# Patient Record
Sex: Male | Born: 1970 | Race: Black or African American | Hispanic: No | Smoking: Never smoker
Health system: Southern US, Community
[De-identification: ages and names within clinical notes are randomized; demographics above are authoritative.]

## PROBLEM LIST (undated history)

## (undated) HISTORY — PX: GASTRIC BYPASS: SHX52

---

## 2019-03-17 ENCOUNTER — Encounter (HOSPITAL_COMMUNITY): Payer: Self-pay | Admitting: Family Medicine

## 2019-03-17 ENCOUNTER — Ambulatory Visit (HOSPITAL_COMMUNITY)
Admission: EM | Admit: 2019-03-17 | Discharge: 2019-03-17 | Disposition: A | Discharge status: Home / Self Care | Payer: BLUE CROSS/BLUE SHIELD | Attending: Family Medicine | Admitting: Family Medicine

## 2019-03-17 ENCOUNTER — Other Ambulatory Visit: Payer: Self-pay

## 2019-03-17 DIAGNOSIS — R52 Pain, unspecified: Secondary | ICD-10-CM | POA: Diagnosis not present

## 2019-03-17 DIAGNOSIS — R51 Headache: Secondary | ICD-10-CM

## 2019-03-17 DIAGNOSIS — E860 Dehydration: Secondary | ICD-10-CM

## 2019-03-17 DIAGNOSIS — B349 Viral infection, unspecified: Secondary | ICD-10-CM

## 2019-03-17 DIAGNOSIS — R509 Fever, unspecified: Secondary | ICD-10-CM

## 2019-03-17 LAB — POCT URINALYSIS DIP (DEVICE)
Glucose, UA: 100 mg/dL — AB
Ketones, ur: 40 mg/dL — AB
Leukocytes,Ua: NEGATIVE
Nitrite: NEGATIVE
Protein, ur: 100 mg/dL — AB
Specific Gravity, Urine: >=1.03 (ref 1.005–1.030)
Urobilinogen, UA: 0.2 mg/dL (ref 0.0–1.0)
pH: 5.5 (ref 5.0–8.0)

## 2019-03-17 MED ORDER — ACETAMINOPHEN 325 MG PO TABS
ORAL_TABLET | ORAL | Status: AC
  Filled 2019-03-17: qty 2

## 2019-03-17 MED ORDER — ACETAMINOPHEN 325 MG PO TABS
650.0000 mg | ORAL_TABLET | Freq: Once | ORAL | Status: AC
  Administered 2019-03-17: 650 mg via ORAL

## 2019-03-17 NOTE — ED Notes (Signed)
Patient verbalizes understanding of discharge instructions. Opportunity for questioning and answers were provided. Patient discharged from [facility] by provider.  

## 2019-03-17 NOTE — Discharge Instructions (Addendum)
This is some sort of viral illness. Unfortunately we have no way to test for the coronavirus so I cannot rule this out I would like for you to go home and quarantine for the next 7 days. You can take up to 1000 mg of Tylenol every 8 hours You can take up to 800 mg of ibuprofen every 8 hours Make sure you are staying hydrated and drink plenty of fluids include water and Gatorade to keep your electrolytes If your symptoms worsen to include chest pain, shortness of breath please go to the ER

## 2019-03-17 NOTE — ED Triage Notes (Signed)
Pt presents with nausea, headache, generalized body aches, and fever since Monday.

## 2019-03-18 NOTE — ED Provider Notes (Signed)
[facility]    CSN: [id number] Arrival date & time: 03/17/19  1524     History   Chief Complaint Chief Complaint  Patient presents with  . Generalized Body Aches  . Fever    HPI Randall Campbell is a 48 y.o. male.   Pt is a 48 year old male that presents with fever, body aches, headache since Monday. Symptoms have been constant and remain the same. He has been taking tylenol, one time a day for symptoms. No recent sick contacts or exposure to COVID. Denies any associated cough, congestion, rhinorrhea, sore throat, chest pain, SOB. Denies any urinary symptoms, abdominal pain, vomiting or diarrhea.   ROS per HPI    Fever    History reviewed. No pertinent past medical history.  There are no active problems to display for this patient.   Past Surgical History:  Procedure Laterality Date  . GASTRIC BYPASS         Home Medications    Prior to Admission medications   Not on File    Family History Family History  Family history unknown: Yes    Social History Social History   Tobacco Use  . Smoking status: Never Smoker  . Smokeless tobacco: Never Used  Substance Use Topics  . Alcohol use: Not on file  . Drug use: Not on file     Allergies   Patient has no known allergies.   Review of Systems Review of Systems  Constitutional: Positive for fever.     Physical Exam Triage Vital Signs ED Triage Vitals  Enc Vitals Group     BP 03/17/19 1602 112/90     Pulse Rate 03/17/19 1602 99     Resp 03/17/19 1602 18     Temp 03/17/19 1602 (!) 102.7 F (39.3 C)     Temp Source 03/17/19 1602 Oral     SpO2 03/17/19 1602 98 %     Weight --      Height --      Head Circumference --      Peak Flow --      Pain Score 03/17/19 1605 7     Pain Loc --      Pain Edu? --      Excl. in GC? --    No data found.  Updated Vital Signs BP 112/90 (BP Location: Right Arm)   Pulse 99   Temp (!) 102.7 F (39.3 C) (Oral)   Resp 18   SpO2 98%    Visual Acuity Right Eye Distance:   Left Eye Distance:   Bilateral Distance:    Right Eye Near:   Left Eye Near:    Bilateral Near:     Physical Exam Vitals signs and nursing note reviewed.  Constitutional:      General: He is not in acute distress.    Appearance: Normal appearance. He is well-developed. He is not ill-appearing, toxic-appearing or diaphoretic.  HENT:     Head: Normocephalic and atraumatic.     Right Ear: Tympanic membrane and ear canal normal.     Left Ear: Tympanic membrane and ear canal normal.     Nose: Nose normal.     Mouth/Throat:     Pharynx: Oropharynx is clear.  Eyes:     Conjunctiva/sclera: Conjunctivae normal.  Neck:     Musculoskeletal: Normal range of motion and neck supple.  Cardiovascular:     Rate and Rhythm: Normal rate and regular rhythm.     Heart  sounds: No murmur.  Pulmonary:     Effort: Pulmonary effort is normal. No respiratory distress.     Breath sounds: Normal breath sounds.  Abdominal:     Palpations: Abdomen is soft.     Tenderness: There is no abdominal tenderness.  Musculoskeletal: Normal range of motion.  Lymphadenopathy:     Cervical: No cervical adenopathy.  Skin:    General: Skin is warm and dry.  Neurological:     Mental Status: He is alert.  Psychiatric:        Mood and Affect: Mood normal.      UC Treatments / Results  Labs (all labs ordered are listed, but only abnormal results are displayed) Labs Reviewed  POCT URINALYSIS DIP (DEVICE) - Abnormal; Notable for the following components:      Result Value   Glucose, UA 100 (*)    Bilirubin Urine SMALL (*)    Ketones, ur 40 (*)    Hgb urine dipstick TRACE (*)    Protein, ur 100 (*)    All other components within normal limits    EKG None  Radiology No results found.  Procedures Procedures (including critical care time)  Medications Ordered in UC Medications  acetaminophen (TYLENOL) tablet 650 mg (650 mg Oral Given 03/17/19 1612)    Initial  Impression / Assessment and Plan / UC Course  I have reviewed the triage vital signs and the nursing notes.  Pertinent labs & imaging results that were available during my care of the patient were reviewed by me and considered in my medical decision making (see chart for details).    Viral illness-   Exam normal VSS besides fever and he is non toxic or ill appearing.  Cannot r/o COVID at this point although he has no respiratory symptoms.  I feel the safest thing is to have him quarantine.  Urine showed mild dehydration. Recommended he increase fluids.  Strict precautions that if his symptoms worsen to include SOB he needs to go to the ER. Otherwise just quarantine for now. He can take tylenol or ibuprofen for the fever and pain.  Pt understanding and agreed.  Final Clinical Impressions(s) / UC Diagnoses   Final diagnoses:  None     Discharge Instructions     This is some sort of viral illness. Unfortunately we have no way to test for the coronavirus so I cannot rule this out I would like for you to go home and quarantine for the next 7 days. You can take up to 1000 mg of Tylenol every 8 hours You can take up to 800 mg of ibuprofen every 8 hours Make sure you are staying hydrated and drink plenty of fluids include water and Gatorade to keep your electrolytes If your symptoms worsen to include chest pain, shortness of breath please go to the ER    ED Prescriptions    None     Controlled Substance Prescriptions [place] Controlled Substance Registry consulted? Not Applicable   Janace Aris, NP 03/18/19 [phone]

## 2019-03-19 ENCOUNTER — Encounter (HOSPITAL_COMMUNITY): Payer: Self-pay | Admitting: Emergency Medicine

## 2019-03-19 ENCOUNTER — Emergency Department (HOSPITAL_COMMUNITY)
Admission: EM | Admit: 2019-03-19 | Discharge: 2019-03-19 | Disposition: A | Discharge status: Home / Self Care | Payer: BLUE CROSS/BLUE SHIELD | Attending: Emergency Medicine | Admitting: Emergency Medicine

## 2019-03-19 ENCOUNTER — Emergency Department (HOSPITAL_COMMUNITY): Payer: BLUE CROSS/BLUE SHIELD

## 2019-03-19 ENCOUNTER — Other Ambulatory Visit: Payer: Self-pay

## 2019-03-19 DIAGNOSIS — R509 Fever, unspecified: Secondary | ICD-10-CM | POA: Diagnosis present

## 2019-03-19 DIAGNOSIS — R6889 Other general symptoms and signs: Secondary | ICD-10-CM

## 2019-03-19 DIAGNOSIS — Z951 Presence of aortocoronary bypass graft: Secondary | ICD-10-CM | POA: Insufficient documentation

## 2019-03-19 DIAGNOSIS — Z20822 Contact with and (suspected) exposure to covid-19: Secondary | ICD-10-CM

## 2019-03-19 MED ORDER — AZITHROMYCIN 250 MG PO TABS: ORAL_TABLET | ORAL | 0 refills | Status: DC

## 2019-03-19 MED ORDER — ACETAMINOPHEN 500 MG PO TABS
1000.0000 mg | ORAL_TABLET | Freq: Once | ORAL | Status: AC
  Administered 2019-03-19: 1000 mg via ORAL
  Filled 2019-03-19: qty 2

## 2019-03-19 MED ORDER — AMOXICILLIN 500 MG PO CAPS: 500.0000 mg | ORAL_CAPSULE | Freq: Three times a day (TID) | ORAL | 0 refills | Status: DC

## 2019-03-19 NOTE — ED Provider Notes (Signed)
[facility] EMERGENCY DEPARTMENT Provider Note   CSN: [id number] Arrival date & time: 03/19/19  1823    History   Chief Complaint Chief Complaint  Patient presents with  . Generalized Body Aches    HPI Randall Campbell is a 48 y.o. male.     The history is provided by the patient and medical records. No language interpreter was used.     48 year old male presenting with flulike symptoms.  Patient report for the past week he has had fever, chills, body aches, headache, and generalized fatigue.  Furthermore, he also report loss of taste and smell, and loss of appetite.  States he is very weak, requiring quite a bit of energy just to function.  He has been self isolating.  He denies any recent sick contact or any recent travel to hospitalization of COVID-19.  He was seen at urgent care 2 days ago for his symptoms and was given symptomatic treatment.  He is here because his symptoms has not improved.  He does not have any significant past medical history.  Has been taking Tylenol and ibuprofen at home.  He is not a smoker.  History reviewed. No pertinent past medical history.  There are no active problems to display for this patient.   Past Surgical History:  Procedure Laterality Date  . GASTRIC BYPASS          Home Medications    Prior to Admission medications   Not on File    Family History Family History  Family history unknown: Yes    Social History Social History   Tobacco Use  . Smoking status: Never Smoker  . Smokeless tobacco: Never Used  Substance Use Topics  . Alcohol use: Not on file  . Drug use: Not on file     Allergies   Patient has no known allergies.   Review of Systems Review of Systems  All other systems reviewed and are negative.    Physical Exam Updated Vital Signs BP 123/80 (BP Location: Right Arm)   Pulse (!) 103   Temp (!) 101 F (38.3 C) (Oral)   Resp (!) 21   SpO2 95%   Physical Exam Vitals  signs and nursing note reviewed.  Constitutional:      General: He is not in acute distress.    Appearance: He is well-developed.  HENT:     Head: Atraumatic.     Mouth/Throat:     Mouth: Mucous membranes are moist.  Eyes:     Conjunctiva/sclera: Conjunctivae normal.  Neck:     Musculoskeletal: Neck supple. No neck rigidity.  Cardiovascular:     Rate and Rhythm: Tachycardia present.     Pulses: Normal pulses.     Heart sounds: Normal heart sounds.  Pulmonary:     Effort: Pulmonary effort is normal.     Breath sounds: Normal breath sounds. No wheezing, rhonchi or rales.  Abdominal:     Palpations: Abdomen is soft.     Tenderness: There is no abdominal tenderness.  Musculoskeletal:        General: No swelling.  Skin:    Findings: No rash.  Neurological:     Mental Status: He is alert and oriented to person, place, and time.  Psychiatric:        Mood and Affect: Mood normal.      ED Treatments / Results  Labs (all labs ordered are listed, but only abnormal results are displayed) Labs Reviewed - No data to  display  EKG None  Radiology Dg Chest Portable 1 View  Result Date: 03/19/2019 CLINICAL DATA:  Five day history of fever and body aches EXAM: PORTABLE CHEST 1 VIEW COMPARISON:  None. FINDINGS: Lungs are adequately inflated with subtle hazy prominence of the bronchovascular markings over the right upper lung and lung bases. No focal lobar consolidation or effusion. Cardiomediastinal silhouette is within normal. There are degenerative changes of the spine. IMPRESSION: Subtle hazy prominence of the bronchovascular markings over the lung bases and right upper lung which may be seen with atypical infection, inflammatory processes or possible viral pneumonia. Electronically Signed   By: Elberta Fortis M.D.   On: 03/19/2019 19:57    Procedures Procedures (including critical care time)  Medications Ordered in ED Medications  acetaminophen (TYLENOL) tablet 1,000 mg (1,000 mg  Oral Given 03/19/19 2001)     Initial Impression / Assessment and Plan / ED Course  I have reviewed the triage vital signs and the nursing notes.  Pertinent labs & imaging results that were available during my care of the patient were reviewed by me and considered in my medical decision making (see chart for details).        BP 123/80 (BP Location: Right Arm)   Pulse (!) 103   Temp (!) 101 F (38.3 C) (Oral)   Resp (!) 21   SpO2 95%    Final Clinical Impressions(s) / ED Diagnoses   Final diagnoses:  Suspected Covid-19 Virus Infection    ED Discharge Orders         Ordered    amoxicillin (AMOXIL) 500 MG capsule  3 times daily     03/19/19 2040    azithromycin (ZITHROMAX Z-PAK) 250 MG tablet     03/19/19 2040         Patient presents with symptoms concerning for COVID-19.  He is febrile, with a report of generalized myalgias, and fatigue.  Chest x-ray shows subtle hazy prominence of the bronchovascular markings suggestive of atypical infection possible viral pneumonia.  Fortunately he is not hypoxic, able to carry in normal conversation and is otherwise well-appearing.  Patient does not meet requirement for admission at this time.  Will obtain COVID-19 testing given history current hospital visit for same symptoms and potential worsening of his condition.  Encourage patient to self isolate, continue to hydrate use, and take Tylenol for his symptoms.  Explained to him that symptoms may last for 1 to 2 weeks.  Patient was given strict return precaution as well as strict home isolation.  Patient voiced understanding and agrees with plan. Will prescribe azithromycin and amoxicillin to cover for atypical pneumonia.   Randall Campbell was evaluated in Emergency Department on 03/19/2019 for the symptoms described in the history of present illness. He was evaluated in the context of the global COVID-19 pandemic, which necessitated consideration that the patient might be at risk for  infection with the SARS-CoV-2 virus that causes COVID-19. Institutional protocols and algorithms that pertain to the evaluation of patients at risk for COVID-19 are in a state of rapid change based on information released by regulatory bodies including the CDC and federal and state organizations. These policies and algorithms were followed during the patient's care in the ED.    Fayrene Helper, PA-C 03/19/19 2043    Melene Plan, DO 03/19/19 2221

## 2019-03-19 NOTE — ED Triage Notes (Signed)
Pt arrives to ED with c/o of 5 day hx of fevers and body aches. Seen at [facility] on 4/1

## 2019-03-19 NOTE — ED Notes (Signed)
Reports last took tylenol at 0700 this morning.  Patient brought own tylenol with him but has not taken any since then.  States he has been taking this every 8 hours.

## 2019-03-19 NOTE — Discharge Instructions (Signed)
Person Under Monitoring Name: Randall Campbell  Location: [address] [place] [address]   Infection Prevention Recommendations for Individuals Confirmed to have, or Being Evaluated for, 2019 Novel Coronavirus (COVID-19) Infection Who Receive Care at Home  Individuals who are confirmed to have, or are being evaluated for, COVID-19 should follow the prevention steps below until a healthcare provider or local or state health department says they can return to normal activities.  Stay home except to get medical care You should restrict activities outside your home, except for getting medical care. Do not go to work, school, or public areas, and do not use public transportation or taxis.  Call ahead before visiting your doctor Before your medical appointment, call the healthcare provider and tell them that you have, or are being evaluated for, COVID-19 infection. This will help the healthcare providers office take steps to keep other people from getting infected. Ask your healthcare provider to call the local or state health department.  Monitor your symptoms Seek prompt medical attention if your illness is worsening (e.g., difficulty breathing). Before going to your medical appointment, call the healthcare provider and tell them that you have, or are being evaluated for, COVID-19 infection. Ask your healthcare provider to call the local or state health department.  Wear a facemask You should wear a facemask that covers your nose and mouth when you are in the same room with other people and when you visit a healthcare provider. People who live with or visit you should also wear a facemask while they are in the same room with you.  Separate yourself from other people in your home As much as possible, you should stay in a different room from other people in your home. Also, you should use a separate bathroom, if available.  Avoid sharing household items You should not  share dishes, drinking glasses, cups, eating utensils, towels, bedding, or other items with other people in your home. After using these items, you should wash them thoroughly with soap and water.  Cover your coughs and sneezes Cover your mouth and nose with a tissue when you cough or sneeze, or you can cough or sneeze into your sleeve. Throw used tissues in a lined trash can, and immediately wash your hands with soap and water for at least 20 seconds or use an alcohol-based hand rub.  Wash your [employer] your hands often and thoroughly with soap and water for at least 20 seconds. You can use an alcohol-based hand sanitizer if soap and water are not available and if your hands are not visibly dirty. Avoid touching your eyes, nose, and mouth with unwashed hands.   Prevention Steps for Caregivers and Household Members of Individuals Confirmed to have, or Being Evaluated for, COVID-19 Infection Being Cared for in the Home  If you live with, or provide care at home for, a person confirmed to have, or being evaluated for, COVID-19 infection please follow these guidelines to prevent infection:  Follow healthcare providers instructions Make sure that you understand and can help the patient follow any healthcare provider instructions for all care.  Provide for the patients basic needs You should help the patient with basic needs in the home and provide support for getting groceries, prescriptions, and other personal needs.  Monitor the patients symptoms If they are getting sicker, call his or her medical provider and tell them that the patient has, or is being evaluated for, COVID-19 infection. This will help the healthcare providers office  take steps to keep other people from getting infected. Ask the healthcare provider to call the local or state health department.  Limit the number of people who have contact with the patient If possible, have only one caregiver for the  patient. Other household members should stay in another home or place of residence. If this is not possible, they should stay in another room, or be separated from the patient as much as possible. Use a separate bathroom, if available. Restrict visitors who do not have an essential need to be in the home.  Keep older adults, very young children, and other sick people away from the patient Keep older adults, very young children, and those who have compromised immune systems or chronic health conditions away from the patient. This includes people with chronic heart, lung, or kidney conditions, diabetes, and cancer.  Ensure good ventilation Make sure that shared spaces in the home have good air flow, such as from an air conditioner or an opened window, weather permitting.  Wash your hands often Wash your hands often and thoroughly with soap and water for at least 20 seconds. You can use an alcohol based hand sanitizer if soap and water are not available and if your hands are not visibly dirty. Avoid touching your eyes, nose, and mouth with unwashed hands. Use disposable paper towels to dry your hands. If not available, use dedicated cloth towels and replace them when they become wet.  Wear a facemask and gloves Wear a disposable facemask at all times in the room and gloves when you touch or have contact with the patients blood, body fluids, and/or secretions or excretions, such as sweat, saliva, sputum, nasal mucus, vomit, urine, or feces.  Ensure the mask fits over your nose and mouth tightly, and do not touch it during use. Throw out disposable facemasks and gloves after using them. Do not reuse. Wash your hands immediately after removing your facemask and gloves. If your personal clothing becomes contaminated, carefully remove clothing and launder. Wash your hands after handling contaminated clothing. Place all used disposable facemasks, gloves, and other waste in a lined container before  disposing them with other household waste. Remove gloves and wash your hands immediately after handling these items.  Do not share dishes, glasses, or other household items with the patient Avoid sharing household items. You should not share dishes, drinking glasses, cups, eating utensils, towels, bedding, or other items with a patient who is confirmed to have, or being evaluated for, COVID-19 infection. After the person uses these items, you should wash them thoroughly with soap and water.  Wash laundry thoroughly Immediately remove and wash clothes or bedding that have blood, body fluids, and/or secretions or excretions, such as sweat, saliva, sputum, nasal mucus, vomit, urine, or feces, on them. Wear gloves when handling laundry from the patient. Read and follow directions on labels of laundry or clothing items and detergent. In general, wash and dry with the warmest temperatures recommended on the label.  Clean all areas the individual has used often Clean all touchable surfaces, such as counters, tabletops, doorknobs, bathroom fixtures, toilets, phones, keyboards, tablets, and bedside tables, every day. Also, clean any surfaces that may have blood, body fluids, and/or secretions or excretions on them. Wear gloves when cleaning surfaces the patient has come in contact with. Use a diluted bleach solution (e.g., dilute bleach with 1 part bleach and 10 parts water) or a household disinfectant with a label that says EPA-registered for coronaviruses. To make a bleach  solution at home, add 1 tablespoon of bleach to 1 quart (4 cups) of water. For a larger supply, add  cup of bleach to 1 gallon (16 cups) of water. Read labels of cleaning products and follow recommendations provided on product labels. Labels contain instructions for safe and effective use of the cleaning product including precautions you should take when applying the product, such as wearing gloves or eye protection and making sure you  have good ventilation during use of the product. Remove gloves and wash hands immediately after cleaning.  Monitor yourself for signs and symptoms of illness Caregivers and household members are considered close contacts, should monitor their health, and will be asked to limit movement outside of the home to the extent possible. Follow the monitoring steps for close contacts listed on the symptom monitoring form.   ? If you have additional questions, contact your local health department or call the epidemiologist on call at [phone] (available 24/7). ? This guidance is subject to change. For the most up-to-date guidance from [facility], please refer to their website: [email or web address]

## 2019-03-20 ENCOUNTER — Telehealth: Payer: Self-pay | Admitting: Internal Medicine

## 2019-03-20 NOTE — Telephone Encounter (Signed)
Spoke with patient to give results of covid 19 positive. Gave recommendations for public health

## 2019-03-22 LAB — NOVEL CORONAVIRUS, NAA (HOSP ORDER, SEND-OUT TO REF LAB; TAT 18-24 HRS): SARS-CoV-2, NAA: DETECTED — AB

## 2019-03-23 ENCOUNTER — Inpatient Hospital Stay (HOSPITAL_COMMUNITY)
Admission: EM | Admit: 2019-03-23 | Discharge: 2019-03-27 | DRG: 177 | Disposition: A | Discharge status: Home / Self Care | Payer: BLUE CROSS/BLUE SHIELD | Attending: Internal Medicine | Admitting: Internal Medicine

## 2019-03-23 DIAGNOSIS — Z9884 Bariatric surgery status: Secondary | ICD-10-CM

## 2019-03-23 DIAGNOSIS — J988 Other specified respiratory disorders: Secondary | ICD-10-CM

## 2019-03-23 DIAGNOSIS — J1289 Other viral pneumonia: Secondary | ICD-10-CM | POA: Diagnosis present

## 2019-03-23 DIAGNOSIS — J1282 Pneumonia due to coronavirus disease 2019: Secondary | ICD-10-CM

## 2019-03-23 DIAGNOSIS — N179 Acute kidney failure, unspecified: Secondary | ICD-10-CM | POA: Diagnosis present

## 2019-03-23 DIAGNOSIS — R0602 Shortness of breath: Secondary | ICD-10-CM | POA: Diagnosis not present

## 2019-03-23 DIAGNOSIS — J9601 Acute respiratory failure with hypoxia: Secondary | ICD-10-CM | POA: Diagnosis present

## 2019-03-24 ENCOUNTER — Other Ambulatory Visit: Payer: Self-pay

## 2019-03-24 ENCOUNTER — Encounter (HOSPITAL_COMMUNITY): Payer: Self-pay | Admitting: Internal Medicine

## 2019-03-24 ENCOUNTER — Emergency Department (HOSPITAL_COMMUNITY): Payer: BLUE CROSS/BLUE SHIELD

## 2019-03-24 DIAGNOSIS — N179 Acute kidney failure, unspecified: Secondary | ICD-10-CM

## 2019-03-24 DIAGNOSIS — R0602 Shortness of breath: Secondary | ICD-10-CM | POA: Diagnosis present

## 2019-03-24 DIAGNOSIS — J1289 Other viral pneumonia: Secondary | ICD-10-CM | POA: Diagnosis present

## 2019-03-24 DIAGNOSIS — J988 Other specified respiratory disorders: Secondary | ICD-10-CM

## 2019-03-24 DIAGNOSIS — Z9884 Bariatric surgery status: Secondary | ICD-10-CM | POA: Diagnosis not present

## 2019-03-24 DIAGNOSIS — J9601 Acute respiratory failure with hypoxia: Secondary | ICD-10-CM

## 2019-03-24 LAB — POCT I-STAT 7, (LYTES, BLD GAS, ICA,H+H)
Acid-Base Excess: 4 mmol/L — ABNORMAL HIGH (ref 0.0–2.0)
Acid-Base Excess: 3 mmol/L — ABNORMAL HIGH (ref 0.0–2.0)
Bicarbonate: 29.3 mmol/L — ABNORMAL HIGH (ref 20.0–28.0)
Bicarbonate: 28.4 mmol/L — ABNORMAL HIGH (ref 20.0–28.0)
Calcium, Ion: 1.1 mmol/L — ABNORMAL LOW (ref 1.15–1.40)
Calcium, Ion: 1.1 mmol/L — ABNORMAL LOW (ref 1.15–1.40)
HCT: 42 % (ref 39.0–52.0)
HCT: 44 % (ref 39.0–52.0)
Hemoglobin: 14.3 g/dL (ref 13.0–17.0)
Hemoglobin: 15 g/dL (ref 13.0–17.0)
O2 Saturation: 91 %
O2 Saturation: 97 %
Patient temperature: 99.7
Patient temperature: 97.3
Potassium: 4.2 mmol/L (ref 3.5–5.1)
Potassium: 4.9 mmol/L (ref 3.5–5.1)
Sodium: 137 mmol/L (ref 135–145)
Sodium: 137 mmol/L (ref 135–145)
TCO2: 31 mmol/L (ref 22–32)
TCO2: 30 mmol/L (ref 22–32)
pCO2 arterial: 45.3 mmHg (ref 32.0–48.0)
pCO2 arterial: 42.5 mmHg (ref 32.0–48.0)
pH, Arterial: 7.422 (ref 7.350–7.450)
pH, Arterial: 7.43 (ref 7.350–7.450)
pO2, Arterial: 63 mmHg — ABNORMAL LOW (ref 83.0–108.0)
pO2, Arterial: 88 mmHg (ref 83.0–108.0)

## 2019-03-24 LAB — COMPREHENSIVE METABOLIC PANEL
ALT: 33 U/L (ref 0–44)
ALT: 35 U/L (ref 0–44)
AST: 41 U/L (ref 15–41)
AST: 41 U/L (ref 15–41)
Albumin: 3.2 g/dL — ABNORMAL LOW (ref 3.5–5.0)
Albumin: 3.1 g/dL — ABNORMAL LOW (ref 3.5–5.0)
Alkaline Phosphatase: 63 U/L (ref 38–126)
Alkaline Phosphatase: 72 U/L (ref 38–126)
Anion gap: 14 (ref 5–15)
Anion gap: 12 (ref 5–15)
BUN: 19 mg/dL (ref 6–20)
BUN: 21 mg/dL — ABNORMAL HIGH (ref 6–20)
CO2: 25 mmol/L (ref 22–32)
CO2: 28 mmol/L (ref 22–32)
Calcium: 8.5 mg/dL — ABNORMAL LOW (ref 8.9–10.3)
Calcium: 8.3 mg/dL — ABNORMAL LOW (ref 8.9–10.3)
Chloride: 97 mmol/L — ABNORMAL LOW (ref 98–111)
Chloride: 97 mmol/L — ABNORMAL LOW (ref 98–111)
Creatinine, Ser: 1.5 mg/dL — ABNORMAL HIGH (ref 0.61–1.24)
Creatinine, Ser: 1.13 mg/dL (ref 0.61–1.24)
GFR calc Af Amer: >60 mL/min (ref >60)
GFR calc Af Amer: >60 mL/min (ref >60)
GFR calc non Af Amer: 54 mL/min — ABNORMAL LOW (ref >60)
GFR calc non Af Amer: >60 mL/min (ref >60)
Glucose, Bld: 105 mg/dL — ABNORMAL HIGH (ref 70–99)
Glucose, Bld: 124 mg/dL — ABNORMAL HIGH (ref 70–99)
Potassium: 4.3 mmol/L (ref 3.5–5.1)
Potassium: 4.9 mmol/L (ref 3.5–5.1)
Sodium: 136 mmol/L (ref 135–145)
Sodium: 137 mmol/L (ref 135–145)
Total Bilirubin: 0.5 mg/dL (ref 0.3–1.2)
Total Bilirubin: 0.4 mg/dL (ref 0.3–1.2)
Total Protein: 7.2 g/dL (ref 6.5–8.1)
Total Protein: 7 g/dL (ref 6.5–8.1)

## 2019-03-24 LAB — CBC WITH DIFFERENTIAL/PLATELET
Abs Immature Granulocytes: 0.03 10*3/uL (ref 0.00–0.07)
Basophils Absolute: 0 10*3/uL (ref 0.0–0.1)
Basophils Relative: 0 %
Eosinophils Absolute: 0 10*3/uL (ref 0.0–0.5)
Eosinophils Relative: 1 %
HCT: 45 % (ref 39.0–52.0)
Hemoglobin: 14.2 g/dL (ref 13.0–17.0)
Immature Granulocytes: 1 %
Lymphocytes Relative: 21 %
Lymphs Abs: 0.9 10*3/uL (ref 0.7–4.0)
MCH: 27 pg (ref 26.0–34.0)
MCHC: 31.6 g/dL (ref 30.0–36.0)
MCV: 85.6 fL (ref 80.0–100.0)
Monocytes Absolute: 0.3 10*3/uL (ref 0.1–1.0)
Monocytes Relative: 8 %
Neutro Abs: 2.9 10*3/uL (ref 1.7–7.7)
Neutrophils Relative %: 69 %
Platelets: 128 10*3/uL — ABNORMAL LOW (ref 150–400)
RBC: 5.26 MIL/uL (ref 4.22–5.81)
RDW: 13.9 % (ref 11.5–15.5)
WBC: 4.1 10*3/uL (ref 4.0–10.5)
nRBC: 0 % (ref 0.0–0.2)

## 2019-03-24 LAB — CBC
HCT: 45.1 % (ref 39.0–52.0)
Hemoglobin: 14.4 g/dL (ref 13.0–17.0)
MCH: 27.7 pg (ref 26.0–34.0)
MCHC: 31.9 g/dL (ref 30.0–36.0)
MCV: 86.9 fL (ref 80.0–100.0)
Platelets: 136 10*3/uL — ABNORMAL LOW (ref 150–400)
RBC: 5.19 MIL/uL (ref 4.22–5.81)
RDW: 13.9 % (ref 11.5–15.5)
WBC: 4.5 10*3/uL (ref 4.0–10.5)
nRBC: 0 % (ref 0.0–0.2)

## 2019-03-24 LAB — MRSA PCR SCREENING: MRSA by PCR: NEGATIVE

## 2019-03-24 LAB — BRAIN NATRIURETIC PEPTIDE: B Natriuretic Peptide: 12.9 pg/mL (ref 0.0–100.0)

## 2019-03-24 LAB — LACTIC ACID, PLASMA
Lactic Acid, Venous: 0.8 mmol/L (ref 0.5–1.9)
Lactic Acid, Venous: 0.8 mmol/L (ref 0.5–1.9)

## 2019-03-24 LAB — C-REACTIVE PROTEIN: CRP: 5.6 mg/dL — ABNORMAL HIGH (ref <1.0)

## 2019-03-24 LAB — TROPONIN I: Troponin I: <0.03 ng/mL (ref <0.03)

## 2019-03-24 LAB — HIV ANTIBODY (ROUTINE TESTING W REFLEX): HIV Screen 4th Generation wRfx: NONREACTIVE

## 2019-03-24 MED ORDER — ACETAMINOPHEN 325 MG PO TABS: 650.0000 mg | ORAL_TABLET | Freq: Four times a day (QID) | ORAL | Status: DC | PRN

## 2019-03-24 MED ORDER — SENNOSIDES-DOCUSATE SODIUM 8.6-50 MG PO TABS: 1.0000 | ORAL_TABLET | Freq: Every evening | ORAL | Status: DC | PRN

## 2019-03-24 MED ORDER — SODIUM CHLORIDE 0.9 % IV SOLN
1.0000 g | Freq: Once | INTRAVENOUS | Status: AC
  Administered 2019-03-24: 1 g via INTRAVENOUS
  Filled 2019-03-24: qty 10

## 2019-03-24 MED ORDER — SODIUM CHLORIDE 0.9 % IV BOLUS
1000.0000 mL | Freq: Once | INTRAVENOUS | Status: AC
  Administered 2019-03-24: 1000 mL via INTRAVENOUS

## 2019-03-24 MED ORDER — KETOROLAC TROMETHAMINE 30 MG/ML IJ SOLN
15.0000 mg | Freq: Once | INTRAMUSCULAR | Status: AC
  Administered 2019-03-24: 15 mg via INTRAVENOUS
  Filled 2019-03-24: qty 1

## 2019-03-24 MED ORDER — ENOXAPARIN SODIUM 40 MG/0.4ML ~~LOC~~ SOLN
40.0000 mg | SUBCUTANEOUS | Status: DC
  Administered 2019-03-24 – 2019-03-27 (×4): 40 mg via SUBCUTANEOUS
  Filled 2019-03-24 (×4): qty 0.4

## 2019-03-24 MED ORDER — HYDROXYCHLOROQUINE SULFATE 200 MG PO TABS
200.0000 mg | ORAL_TABLET | Freq: Two times a day (BID) | ORAL | Status: DC
  Administered 2019-03-25 – 2019-03-27 (×5): 200 mg via ORAL
  Filled 2019-03-24 (×5): qty 1

## 2019-03-24 MED ORDER — HYDROXYCHLOROQUINE SULFATE 200 MG PO TABS
400.0000 mg | ORAL_TABLET | Freq: Two times a day (BID) | ORAL | Status: AC
  Administered 2019-03-24 (×2): 400 mg via ORAL
  Filled 2019-03-24 (×2): qty 2

## 2019-03-24 MED ORDER — SODIUM CHLORIDE 0.9 % IV SOLN
500.0000 mg | Freq: Once | INTRAVENOUS | Status: AC
  Administered 2019-03-24: 500 mg via INTRAVENOUS
  Filled 2019-03-24: qty 500

## 2019-03-24 MED ORDER — ACETAMINOPHEN 650 MG RE SUPP: 650.0000 mg | Freq: Four times a day (QID) | RECTAL | Status: DC | PRN

## 2019-03-24 MED ORDER — SODIUM CHLORIDE 0.9 % IV SOLN
1.0000 g | INTRAVENOUS | Status: DC
  Filled 2019-03-24: qty 10

## 2019-03-24 MED ORDER — SODIUM CHLORIDE 0.9 % IV SOLN: 1.0000 g | INTRAVENOUS | Status: DC

## 2019-03-24 NOTE — H&P (Signed)
Date: 03/24/2019               Patient Name:  Randall Campbell MRN: [id number]  DOB: 12/30/1970 Age / Sex: [age] y.o., male   PCP: Patient, No Pcp Per         Medical Service: Internal Medicine Teaching Service         Attending Physician: Dr. Gust RungHoffman, Erik C, DO    First Contact: Dr. Gwyneth RevelsKrienke Pager: [phone]  Second Contact: Dr. Delma Officerhundi Pager: [phone]       After Hours (After 5p/  First Contact Pager: [phone]  weekends / holidays): Second Contact Pager: [phone]   Chief Complaint: dyspnea  History of Present Illness: Randall Campbell is a 48 yo man with no significant medical history who presented to the ED with worsening dyspnea. The patient was seen in the ED on 4/1 for fever, myalgias, and headaches. He was discharged home to self quarantine. He came back to the ED on 4/3 with ongoing symptoms plus fatigue, generalized weakness, and anorexia. CXR showed hazy prominence of the bronchovascular markers. He was not hypoxic at the time. He was prescribed azithromycin and amoxicillin for CAP. As he was not hypoxic, he was discharged home. He was tested for Covid at this time, which resulted as positive a few days later. He returned to the ED today because he became increasingly short of breath yesterday. He is dyspneic at rest. He denies cough, chest pain, diarrhea, muscle aches, or fevers currently. He denies any sick contacts. He has been self-quarantining at home since the onset of his symptoms. He is a non-smoker and takes no medications at home.   Upon arrival to the ED, he was afebrile, tachypneic at 23, and hypoxic to 83%, requiring 4L supplemental oxygen. Labs significant for no lymphophenia, thrombocytopenia of 128, Cr 1.5 (no recent baseline), BNP wnl, lactate wnl. ABG with pH 7.4, pCO2 45, pO2 63, bicarb 29.3 on 4L supplemental oxygen. EKG NSR without ischemic changes. CXR with progressive bilateral opacities consistent with progressive multifocal pneumonia. He was given 1L NS, toradol,  and ceftriaxone in the ED.   Meds:  No home medications  Allergies: Allergies as of 03/23/2019  . (No Known Allergies)   No past medical history on file. Past Surgical History:  Procedure Laterality Date  . GASTRIC BYPASS      Family History: No pertinent family hx. No sick contacts.  Social History: Consulting civil engineerMaintenance worker. Never smoker. Denies etoh or illicit drug use.  Review of Systems: A complete ROS was negative except as per HPI.   Physical Exam: Blood pressure 112/88, pulse 95, temperature 99.7 F (37.6 C), temperature source Oral, resp. rate (!) 24, height 6\' 3"  (1.905 m), weight 99.8 kg, SpO2 (!) 83 %.  Constitutional: Well-developed, well-nourished, and in no distress.  Eyes: Pupils are equal, round, and reactive to light. EOM are normal.  Cardiovascular: Normal rate and regular rhythm. No murmurs, rubs, or gallops. Pulmonary/Chest: Effort normal. Saturating well on 4L. Bilateral crackles. No wheezes. Abdominal: Bowel sounds present. Soft, non-distended, non-tender. Ext: No lower extremity edema. Skin: Warm and dry. No rashes or wounds.  EKG: personally reviewed my interpretation is NSR without acute ischemic changes.  CXR: personally reviewed my interpretation is bilateral opacities.  Assessment & Plan by Problem: Active Problems:   COVID-19  Covid-19 Infection: Randall Campbell is a 48 yo man with no significant medical history who presented with worsening dyspnea in the setting of known Covid-19 infection for about one week  prior to admission. He had completed 5 days of azithromycin and amoxicillin. He was hypoxic, requiring 4L supplemental oxygen. ABG showed hypoxemia with a pO2 of 63 while on 4L, and CXR showed progressive bilateral opacities, worsened since prior film five days prior. He had crackles on lung exam, but is satting well on 4L. P/F ratio is 175, concerning for severe ARDS. Not febrile. No lymphopenia or transaminitis. He appears stable for the floor at  this time, but there is low threshold to consult PCCM if his oxygen requirement rapidly increases. - Continue supplemental oxygen as needed - Tele - Repeat ABG - CRP - Continue ceftriaxone - Airborne and contact precautions  AKI: Cr 1.5. No current baseline in system. Likely prerenal 2/2 poor PO intake and infection. Received 1L NS.  - Trend Cr  FEN: no IV fluids, NPO diet, replace electrolytes as needed  DVT ppx: Lovenox Code status: FULL code  Dispo: Admit patient to Inpatient with expected length of stay greater than 2 midnights.  Signed: Dionne Ano, MD 03/24/2019, 3:04 AM  Pager: [phone]

## 2019-03-24 NOTE — ED Notes (Signed)
Emergency Contact: [phone] Mother - Jerame Duerksen

## 2019-03-24 NOTE — ED Provider Notes (Signed)
Randall Campbell [facility] EMERGENCY DEPARTMENT Provider Note   CSN: [id number] Arrival date & time: 03/23/19  2358    History   Chief Complaint Chief Complaint  Patient presents with  . Shortness of Breath    HPI Randall Campbell is a 48 y.o. male.     Patient presents with a 8-day history of fever, body aches, cough, fatigue, loss of appetite, loss of smell and generalized weakness.  He was seen in the ED on April 3 and his coronavirus test was subsequently positive.  He denies having any sick contacts or known exposure to coronavirus.  His girlfriend is not sick.  He denies letting himself at home for the past week.  He has had fever with body aches and chills and cough and been alternating Tylenol and ibuprofen at home.  Today EMS was called because of shortness of breath again progressively worse.  He denies any productivity to his cough as it is been mostly clear.  He has no history of COPD or asthma.  Does not smoke.  No leg pain or leg swelling.  He denies any chest pain or abdominal pain.  He was found to be hypoxic in the 80s on EMS arrival was given O2 supplementation and Solu-Medrol.   The history is provided by the EMS personnel and the patient. The history is limited by the condition of the patient.  Shortness of Breath  Associated symptoms: cough and headaches   Associated symptoms: no abdominal pain, no chest pain, no rash and no vomiting     No past medical history on file.  There are no active problems to display for this patient.   Past Surgical History:  Procedure Laterality Date  . GASTRIC BYPASS          Home Medications    Prior to Admission medications   Medication Sig Start Date End Date Taking? Authorizing Provider  amoxicillin (AMOXIL) 500 MG capsule Take 1 capsule (500 mg total) by mouth 3 (three) times daily. 03/19/19   Fayrene Helper, PA-C  azithromycin (ZITHROMAX Z-PAK) 250 MG tablet 2 po day one, then 1 daily x 4 days 03/19/19   Fayrene Helper, PA-C    Family History Family History  Family history unknown: Yes    Social History Social History   Tobacco Use  . Smoking status: Never Smoker  . Smokeless tobacco: Never Used  Substance Use Topics  . Alcohol use: Not on file  . Drug use: Not on file     Allergies   Patient has no known allergies.   Review of Systems Review of Systems  Constitutional: Positive for activity change, appetite change, chills and fatigue.  HENT: Positive for congestion and rhinorrhea.   Respiratory: Positive for cough and shortness of breath.   Cardiovascular: Negative for chest pain.  Gastrointestinal: Negative for abdominal pain, nausea and vomiting.  Musculoskeletal: Positive for arthralgias and myalgias.  Skin: Negative for rash.  Neurological: Positive for weakness, light-headedness and headaches.    all other systems are negative except as noted in the HPI and PMH.   Physical Exam Updated Vital Signs BP 116/87 (BP Location: Right Arm)   Pulse 94   Temp 99.7 F (37.6 C) (Oral)   Resp (!) 23   Ht  (1.905 m)   Wt 99.8 kg   SpO2 94%   BMI 27.50 kg/m   Physical Exam Vitals signs and nursing note reviewed.  Constitutional:      General: He is not  in acute distress.    Appearance: He is well-developed. He is ill-appearing.     Comments: Mild increased work of breathing, speaking in full sentences  HENT:     Head: Normocephalic and atraumatic.     Nose: Nose normal. No rhinorrhea.     Mouth/Throat:     Mouth: Mucous membranes are moist.     Pharynx: No oropharyngeal exudate.  Eyes:     Conjunctiva/sclera: Conjunctivae normal.     Pupils: Pupils are equal, round, and reactive to light.  Neck:     Musculoskeletal: Normal range of motion and neck supple.     Comments: No meningismus. Cardiovascular:     Rate and Rhythm: Normal rate and regular rhythm.     Heart sounds: Normal heart sounds. No murmur.  Pulmonary:     Effort: Respiratory distress present.      Comments: Diminished breath sounds bilaterally Crackles at bases Abdominal:     Palpations: Abdomen is soft.     Tenderness: There is no abdominal tenderness. There is no guarding or rebound.  Musculoskeletal: Normal range of motion.        General: No tenderness.  Skin:    General: Skin is warm.     Capillary Refill: Capillary refill takes 2 to 3 seconds.  Neurological:     General: No focal deficit present.     Mental Status: He is alert and oriented to person, place, and time.     Cranial Nerves: No cranial nerve deficit.     Motor: No abnormal muscle tone.     Coordination: Coordination normal.     Comments: No ataxia on finger to nose bilaterally. No pronator drift. 5/5 strength throughout. CN 2-12 intact.Equal grip strength. Sensation intact.   Psychiatric:        Behavior: Behavior normal.      ED Treatments / Results  Labs (all labs ordered are listed, but only abnormal results are displayed) Labs Reviewed  CBC WITH DIFFERENTIAL/PLATELET - Abnormal; Notable for the following components:      Result Value   Platelets 128 (*)    All other components within normal limits  COMPREHENSIVE METABOLIC PANEL - Abnormal; Notable for the following components:   Chloride 97 (*)    Glucose, Bld 105 (*)    Creatinine, Ser 1.50 (*)    Calcium 8.5 (*)    Albumin 3.2 (*)    GFR calc non Af Amer 54 (*)    All other components within normal limits  POCT I-STAT 7, (LYTES, BLD GAS, ICA,H+H) - Abnormal; Notable for the following components:   pO2, Arterial 63.0 (*)    Bicarbonate 29.3 (*)    Acid-Base Excess 4.0 (*)    Calcium, Ion 1.10 (*)    All other components within normal limits  TROPONIN I  BRAIN NATRIURETIC PEPTIDE  LACTIC ACID, PLASMA  LACTIC ACID, PLASMA  C-REACTIVE PROTEIN  BLOOD GAS, ARTERIAL    EKG EKG Interpretation  Date/Time:  Tuesday March 23 2019 23:59:08 EDT Ventricular Rate:  98 PR Interval:    QRS Duration: 104 QT Interval:  351 QTC Calculation: 449  R Axis:   -8 Text Interpretation:  Sinus rhythm No previous ECGs available Confirmed by Glynn Octave [phone]) on 03/24/2019 12:07:05 AM   Radiology Dg Chest Portable 1 View  Result Date: 03/24/2019 CLINICAL DATA:  Worsening shortness of breath. Covert positive patient. EXAM: PORTABLE CHEST 1 VIEW COMPARISON:  Radiograph 03/19/2019 FINDINGS: Lower lung volumes from prior exam. Progressive ill-defined multifocal  opacities throughout both lungs. Slight enlargement cardiac silhouette likely due to lower lung volumes. Unchanged mediastinal contours. No large pleural effusion or pneumothorax. IMPRESSION: Progressive ill-defined opacities throughout both lungs, most consistent with progressive multifocal pneumonia, pattern typical of COVID-19. Electronically Signed   By: Narda RutherfordMelanie  Sanford M.D.   On: 03/24/2019 01:26    Procedures Procedures (including critical care time)  Medications Ordered in ED Medications  sodium chloride 0.9 % bolus 1,000 mL (1,000 mLs Intravenous Bolus from Bag 03/24/19 0021)     Initial Impression / Assessment and Plan / ED Course  I have reviewed the triage vital signs and the nursing notes.  Pertinent labs & imaging results that were available during my care of the patient were reviewed by me and considered in my medical decision making (see chart for details).       8 days of influenza-like illness with known positive corona test.  Arrives hypoxic with increased shortness of breath.  Speaking in full sentences but dyspneic on nasal cannula.  X-ray shows progression of his bilateral opacities.  Patient given judicious IV fluids as well as IV antibiotics. ABG does not show any significant CO2 retention.  He does have a new oxygen requirement and is persistently tachypneic.  He is requesting to be admitted. Review breathing remained stable on nasal cannula without respiratory distress. He appears appropriate for stepdown admission.  Admission d/w internal medicine  residents.    Sharman Cheekimothy Lamonte Kleinpeter was evaluated in Emergency Department on 03/24/2019 for the symptoms described in the history of present illness. He was evaluated in the context of the global COVID-19 pandemic, which necessitated consideration that the patient might be at risk for infection with the SARS-CoV-2 virus that causes COVID-19. Institutional protocols and algorithms that pertain to the evaluation of patients at risk for COVID-19 are in a state of rapid change based on information released by regulatory bodies including the CDC and federal and state organizations. These policies and algorithms were followed during the patient's care in the ED.   CRITICAL CARE Performed by: Glynn OctaveStephen Beverley Sherrard Total critical care time: 45 minutes Critical care time was exclusive of separately billable procedures and treating other patients. Critical care was necessary to treat or prevent imminent or life-threatening deterioration. Critical care was time spent personally by me on the following activities: development of treatment plan with patient and/or surrogate as well as nursing, discussions with consultants, evaluation of patient's response to treatment, examination of patient, obtaining history from patient or surrogate, ordering and performing treatments and interventions, ordering and review of laboratory studies, ordering and review of radiographic studies, pulse oximetry and re-evaluation of patient's condition.   Final Clinical Impressions(s) / ED Diagnoses   Final diagnoses:  Pneumonia due to 2019 novel coronavirus  Acute respiratory failure with hypoxia [facility](HCC)    ED Discharge Orders    None       Glynn Octaveancour, Braheem Tomasik, MD 03/24/19 0425

## 2019-03-24 NOTE — ED Notes (Signed)
EKG done

## 2019-03-24 NOTE — ED Notes (Signed)
ED TO INPATIENT HANDOFF REPORT  ED Nurse Name and Phone #: [facility] [phone]  S Name/Age/Gender Randall Campbell 48 y.o. male Room/Bed: 023C/023C  Code Status   Code Status: Not on file  Home/SNF/Other Home Patient oriented to: self, place, time and situation Is this baseline? Yes   Triage Complete: Triage complete  Chief Complaint shob  Triage Note Arrived Via EMS for worsening of SOB.  Pt contacted on Saturday and was advised his test for Covid 19 was positive. Pt with marked increased work of breathing per EMS.  EMS gave 125 mg Solu-Medrol.   Allergies No Known Allergies  Level of Care/Admitting Diagnosis ED Disposition    ED Disposition Condition Comment   Admit  [facility] [[id number]]  Level of Care: Progressive [102]  Diagnosis: COVID-19 [[id number]][[phone]]  Admitting Physician: Silvio PateHOFFMAN, ERIK C [[id number]]  Attending Physician: Gust RungHOFFMAN, ERIK C [[id number]]  Estimated length of stay: past midnight tomorrow  Certification:: I certify this patient will need inpatient services for at least 2 midnights  Bed request comments: 2W - COVID19 positive  PT Class (Do Not Modify): Inpatient [101]  PT Acc Code (Do Not Modify): Private [1]       B Medical/Surgery History No past medical history on file. Past Surgical History:  Procedure Laterality Date  . GASTRIC BYPASS       A IV Location/Drains/Wounds Patient Lines/Drains/Airways Status   Active Line/Drains/Airways    Name:   Placement date:   Placement time:   Site:   Days:   Peripheral IV 03/23/19 Left Forearm   03/23/19    2315    Forearm   1          Intake/Output Last 24 hours  Intake/Output Summary (Last 24 hours) at 03/24/2019 0534 Last data filed at 03/24/2019 0306 Gross per 24 hour  Intake 1000 ml  Output -  Net 1000 ml    Labs/Imaging Results for orders placed or performed during the hospital encounter of 03/23/19 (from the past 48 hour(s))  CBC with Differential/Platelet      Status: Abnormal   Collection Time: 03/24/19 12:07 AM  Result Value Ref Range   WBC 4.1 4.0 - 10.5 K/uL   RBC 5.26 4.22 - 5.81 MIL/uL   Hemoglobin 14.2 13.0 - 17.0 g/dL   HCT 66.045.0 63.039.0 - 16.052.0 %   MCV 85.6 80.0 - 100.0 fL   MCH 27.0 26.0 - 34.0 pg   MCHC 31.6 30.0 - 36.0 g/dL   RDW 10.913.9 32.311.5 - 55.715.5 %   Platelets 128 (L) 150 - 400 K/uL   nRBC 0.0 0.0 - 0.2 %   Neutrophils Relative % 69 %   Neutro Abs 2.9 1.7 - 7.7 K/uL   Lymphocytes Relative 21 %   Lymphs Abs 0.9 0.7 - 4.0 K/uL   Monocytes Relative 8 %   Monocytes Absolute 0.3 0.1 - 1.0 K/uL   Eosinophils Relative 1 %   Eosinophils Absolute 0.0 0.0 - 0.5 K/uL   Basophils Relative 0 %   Basophils Absolute 0.0 0.0 - 0.1 K/uL   Immature Granulocytes 1 %   Abs Immature Granulocytes 0.03 0.00 - 0.07 K/uL    Comment: Performed at [facility], [address] [place], [place] [address]  Comprehensive metabolic panel     Status: Abnormal   Collection Time: 03/24/19 12:07 AM  Result Value Ref Range   Sodium 136 135 - 145 mmol/L   Potassium 4.3 3.5 - 5.1 mmol/L  Chloride 97 (L) 98 - 111 mmol/L   CO2 25 22 - 32 mmol/L   Glucose, Bld 105 (H) 70 - 99 mg/dL   BUN 19 6 - 20 mg/dL   Creatinine, Ser 6.80 (H) 0.61 - 1.24 mg/dL   Calcium 8.5 (L) 8.9 - 10.3 mg/dL   Total Protein 7.2 6.5 - 8.1 g/dL   Albumin 3.2 (L) 3.5 - 5.0 g/dL   AST 41 15 - 41 U/L   ALT 33 0 - 44 U/L   Alkaline Phosphatase 63 38 - 126 U/L   Total Bilirubin 0.5 0.3 - 1.2 mg/dL   GFR calc non Af Amer 54 (L) >60 mL/min   GFR calc Af Amer >60 >60 mL/min   Anion gap 14 5 - 15    Comment: Performed at [facility], [address], [place], [place] [address]  Troponin I - ONCE - STAT     Status: None   Collection Time: 03/24/19 12:07 AM  Result Value Ref Range   Troponin I <0.03 <0.03 ng/mL    Comment: Performed at [facility], [address], [place], [place] [address]  Brain natriuretic peptide     Status: None   Collection Time: 03/24/19 12:07 AM   Result Value Ref Range   B Natriuretic Peptide 12.9 0.0 - 100.0 pg/mL    Comment: Performed at [facility], [address] [place], [place] [address]  Lactic acid, plasma     Status: None   Collection Time: 03/24/19 12:07 AM  Result Value Ref Range   Lactic Acid, Venous 0.8 0.5 - 1.9 mmol/L    Comment: Performed at [facility], [address], [place], [place] [address]  I-STAT 7, (LYTES, BLD GAS, ICA, H+H)     Status: Abnormal   Collection Time: 03/24/19  1:39 AM  Result Value Ref Range   pH, Arterial 7.422 7.350 - 7.450   pCO2 arterial 45.3 32.0 - 48.0 mmHg   pO2, Arterial 63.0 (L) 83.0 - 108.0 mmHg   Bicarbonate 29.3 (H) 20.0 - 28.0 mmol/L   TCO2 31 22 - 32 mmol/L   O2 Saturation 91.0 %   Acid-Base Excess 4.0 (H) 0.0 - 2.0 mmol/L   Sodium 137 135 - 145 mmol/L   Potassium 4.2 3.5 - 5.1 mmol/L   Calcium, Ion 1.10 (L) 1.15 - 1.40 mmol/L   HCT 42.0 39.0 - 52.0 %   Hemoglobin 14.3 13.0 - 17.0 g/dL   Patient temperature 69.4 F    Collection site RADIAL, ALLEN'S TEST ACCEPTABLE    Drawn by Operator    Sample type ARTERIAL   Lactic acid, plasma     Status: None   Collection Time: 03/24/19  3:55 AM  Result Value Ref Range   Lactic Acid, Venous 0.8 0.5 - 1.9 mmol/L    Comment: Performed at [facility], [address], [place], [place] [address]   Dg Chest Portable 1 View  Result Date: 03/24/2019 CLINICAL DATA:  Worsening shortness of breath. Covert positive patient. EXAM: PORTABLE CHEST 1 VIEW COMPARISON:  Radiograph 03/19/2019 FINDINGS: Lower lung volumes from prior exam. Progressive ill-defined multifocal opacities throughout both lungs. Slight enlargement cardiac silhouette likely due to lower lung volumes. Unchanged mediastinal contours. No large pleural effusion or pneumothorax. IMPRESSION: Progressive ill-defined opacities throughout both lungs, most consistent with progressive multifocal pneumonia, pattern typical of COVID-19. Electronically Signed   By:  Narda Rutherford M.D.   On: 03/24/2019 01:26  Pending Labs Unresulted Labs (From admission, onward)    Start     Ordered   03/24/19 0600  Blood gas, arterial  Once,   R     03/24/19 0318   03/24/19 0315  C-reactive protein  Add-on,   R     03/24/19 0314   Signed and Held  HIV antibody (Routine Testing)  Tomorrow morning,   R     Signed and Held   Signed and Held  Comprehensive metabolic panel  Tomorrow morning,   R     Signed and Held   Signed and Held  CBC  Tomorrow morning,   R     Signed and Held          Vitals/Pain Today's Vitals   03/24/19 0300 03/24/19 0315 03/24/19 0330 03/24/19 0345  BP: 106/78 114/86 110/80 106/81  Pulse: 67 68 71 68  Resp: 17 (!) Temp:      TempSrc:      SpO2: 91% 95% 96% 92%  Weight:      Height:      PainSc:        Isolation Precautions No active isolations  Medications Medications  cefTRIAXone (ROCEPHIN) 1 g in sodium chloride 0.9 % 100 mL IVPB (has no administration in time range)  sodium chloride 0.9 % bolus 1,000 mL (0 mLs Intravenous Stopped 03/24/19 0306)  ketorolac (TORADOL) 30 MG/ML injection 15 mg (15 mg Intravenous Given 03/24/19 0028)  cefTRIAXone (ROCEPHIN) 1 g in sodium chloride 0.9 % 100 mL IVPB (0 g Intravenous Stopped 03/24/19 0306)  azithromycin (ZITHROMAX) 500 mg in sodium chloride 0.9 % 250 mL IVPB (500 mg Intravenous New Bag/Given 03/24/19 0311)    Mobility walks     Focused Assessments Pulmonary Assessment Handoff:  Lung sounds: Bilateral Breath Sounds: Fine crackles L Breath Sounds: Fine crackles R Breath Sounds: Fine crackles O2 Device: Nasal Cannula O2 Flow Rate (L/min): 4 L/min      R Recommendations: See Admitting Provider Note  Report given to:   Additional Notes: Patient positive for Covid 19

## 2019-03-24 NOTE — ED Triage Notes (Signed)
Arrived Via EMS for worsening of SOB.  Pt contacted on Saturday and was advised his test for Covid 19 was positive. Pt with marked increased work of breathing per EMS.  EMS gave 125 mg Solu-Medrol.

## 2019-03-25 NOTE — Discharge Summary (Signed)
Name: Randall Campbell MRN: [id number] DOB: 12/23/1970 48 y.o. PCP: Patient, No Pcp Per  Date of Admission: 03/23/2019 11:58 PM Date of Discharge: 4/11/20204/11/20 Attending Physician: Dr. Harmon DunEric Hoffman  Discharge Diagnosis: 1. Pneumonia secondary to COVID19 virus   Discharge Medications: Allergies as of 03/27/2019   No Known Allergies     Medication List    STOP taking these medications   amoxicillin 500 MG capsule Commonly known as:  AMOXIL   azithromycin 250 MG tablet Commonly known as:  Zithromax Z-Pak     TAKE these medications   acetaminophen 325 MG tablet Commonly known as:  TYLENOL Take 650 mg by mouth every 6 (six) hours as needed for mild pain or fever.   hydroxychloroquine 200 MG tablet Commonly known as:  PLAQUENIL Take 1 tablet (200 mg total) by mouth 2 (two) times daily for 2 days.       Disposition and follow-up:   Randall Campbell [facility] Good condition.  At the hospital follow up visit please address:  1.  COVID19: please ensure that the patient continues to respirate well on room air and does not have any oxygen requirement. Please evaluate for fevers. Please make sure that he has completed his course of hydroxychloroquine.   2.  Labs / imaging needed at time of follow-up: None  3.  Pending labs/ test needing follow-up: None  Follow-up Appointments: Follow-up Information     [facility] Follow up on 03/30/2019.   Why:  A doctor should call you for a telephone follow up appointment. Contact information: [address] [place] [id number] [phone]          Hospital Course by problem list:  1. Pneumonia secondary to SARS-coV-2 virus This is a 48 year old male with no significant history or immunocompromise who presented with worsening shortness of breath. He was recently seen for fevres, myalgias, headaches, fatigue and weakness, CXR showed  hazy prominence of bronchovascular markers, treated for CAP with 5 day course of azithromycin and amoxicillin. He tested positive for COVID per test ordered on 03/19/19.   On admission, he was found to be tachypneic and hypoxic which improved with supplemental O2. CXR showed ill defined opacities bilaterally. He was started on hydroxychloroquine. Patient's respiratory status improved during hospitalization and he was tapered off of oxygen. On discharge told to finish 5 day course of hydroxychloroquine. I called patient to clarify that he has to take 3 more doses (one night of discharge, and two next day). He was told to self isolate at least 72 hours (which is ready for him per girlfirend) since improvement of his symptoms and he is afebrile without use of antipyretics.   Discharge Vitals:   BP 112/83 (BP Location: Left Arm)   Pulse 65   Temp 98.2 F (36.8 C) (Oral)   Resp 17   Ht 6\' 3"  (1.905 m)   Wt 106.2 kg   SpO2 95%   BMI 29.26 kg/m   Pertinent Labs, Studies, and Procedures:   CBC Latest Ref Rng & Units 03/24/2019 03/24/2019 03/24/2019  WBC 4.0 - 10.5 K/uL 4.5 - -  Hemoglobin 13.0 - 17.0 g/dL 57.814.4 46.915.0 62.914.3  Hematocrit 39.0 - 52.0 % 45.1 44.0 42.0  Platelets 150 - 400 K/uL 136(L) - -    Discharge Instructions: Discharge Instructions    Call MD for:  difficulty breathing, headache or visual disturbances   Complete by:  As directed  Call MD for:  persistant dizziness or light-headedness   Complete by:  As directed    Call MD for:  redness, tenderness, or signs of infection (pain, swelling, redness, odor or green/yellow discharge around incision site)   Complete by:  As directed    Call MD for:  temperature >100.4   Complete by:  As directed    Diet - low sodium heart healthy   Complete by:  As directed    Discharge instructions   Complete by:  As directed    It was a pleasure to take care of you Randall Campbell. During this hospitalization you were taken care of for pneumonia due to  COVID19 virus. Please make sure you self isolate yourself for 72 hours since last fever and since last symptoms. Please finish your course of hydroxychloroquine- one dose at 9/10pm today 4/11 and then twice daily for two additional days. Please let us know if you have any worsening of your respiratory symptoms.   Increase activity slowly   Complete by:  As directed       Signed: Lorenso Courier, MD 03/27/2019, 3:48 PM   Pager: [phone]

## 2019-03-25 NOTE — Progress Notes (Addendum)
   Subjective:   Mr. Neitzke was seen sitting up in his bed doing well. He states that his breathing has improved. He was able to walk to the bathroom yesterday. He denies any dyspnea or chest discomfort.   Objective:  Vital signs in last 24 hours: Vitals:   03/25/19 0200 03/25/19 0400 03/25/19 0600 03/25/19 0812  BP: 112/79 110/76 115/76 117/82  Pulse: 60 (!) 58 60 76  Resp: 17 16 16  (!) 24  Temp:  98 F (36.7 C)  97.8 F (36.6 C)  TempSrc:  Oral  Oral  SpO2: 97% 96% 97% 95%  Weight:      Height:       Physical Exam  Constitutional: He appears well-developed and well-nourished. No distress.  HENT:  Head: Normocephalic and atraumatic.  Eyes: Conjunctivae are normal.  Cardiovascular: Normal rate, regular rhythm and normal heart sounds.  Respiratory: Effort normal and breath sounds normal. No respiratory distress. He has no wheezes.  On 2L via [place]  GI: Soft. Bowel sounds are normal. He exhibits no distension. There is no abdominal tenderness.  Neurological: He is alert.  Skin: Skin is warm. He is not diaphoretic.  Psychiatric: He has a normal mood and affect. His behavior is normal. Judgment and thought content normal.   Assessment/Plan:  Mr. Shamburg is a 48 y.o m with no significant pmh or immunocompromise who presented with respiratory distress. He resulted positive for covid19 on 03/19/19.   Acute hypoxic respiratory failure 2/2 COVID 19 Covid19 testing positive 03/19/19 and cxr on 03/24/19 showed ill-defined opacities throughout bilateral lungs.   Patient is stable respirating well on 2L [place].   -continue hydroxychloroquine 200mg  qd day 2 for 4 days  -Will ask for patient to quarantine for 7 days after discharge.  -Please continue to wean off of oxygen  -Please ambulate in room.   AKI Resolved 1.5 to 1.13.  Dispo: Anticipated discharge in approximately 1-2 day(s).   Lorenso Courier, MD 03/25/2019, 9:54 AM Pager: [phone]

## 2019-03-25 NOTE — TOC Initial Note (Signed)
Transition of Care [facility]) - Initial/Assessment Note    Patient Details  Name: Randall Campbell MRN: [id number] Date of Birth: 02-26-1971  [facility] CM/SW Contact:    Leone Haven, RN Phone Number: 03/25/2019, 10:59 AM  Clinical Narrative:                 NCM spoke with patient, he does not have a PCP, [facility] information given to patient to help ast in getting PCP in network with his insurance.    Expected Discharge Plan: Home/Self Care Barriers to Discharge: No Barriers Identified   Patient Goals and CMS Choice Patient states their goals for this hospitalization and ongoing recovery are:: get better   Choice offered to / list presented to : NA  Expected Discharge Plan and Services Expected Discharge Plan: Home/Self Care In-house Referral: PCP / [facility] Discharge Planning Services: CM Consult Post Acute Care Choice: NA Living arrangements for the past 2 months: Single Family Home                 DME Arranged: N/A DME Agency: NA HH Arranged: NA HH Agency: NA  Prior Living Arrangements/Services Living arrangements for the past 2 months: Single Family Home Lives with:: Spouse Patient language and need for interpreter reviewed:: No Do you feel safe going back to the place where you live?: Yes      Need for Family Participation in Patient Care: No (Comment)     Criminal Activity/Legal Involvement Pertinent to Current Situation/Hospitalization: No - Comment as needed  Activities of Daily Living Home Assistive Devices/Equipment: None ADL Screening (condition at time of admission) Patient's cognitive ability adequate to safely complete daily activities?: Yes Is the patient deaf or have difficulty hearing?: No Does the patient have difficulty seeing, even when wearing glasses/contacts?: No Does the patient have difficulty concentrating, remembering, or making decisions?: No Patient able to express need for assistance with ADLs?:  Yes Does the patient have difficulty dressing or bathing?: No Independently performs ADLs?: Yes (appropriate for developmental age) Does the patient have difficulty walking or climbing stairs?: No Weakness of Legs: None Weakness of Arms/Hands: None  Permission Sought/Granted                  Emotional Assessment   Attitude/Demeanor/Rapport: Engaged Affect (typically observed): Accepting, Appropriate Orientation: : Oriented to Self, Oriented to Place, Oriented to  Time, Oriented to Situation   Psych Involvement: No (comment)  Admission diagnosis:  Acute respiratory failure with hypoxia (HCC) [J96.01] Pneumonia due to 2019 novel coronavirus [U07.1, J12.89] Patient Active Problem List   Diagnosis Date Noted  . COVID-19 03/24/2019   PCP:  Patient, No Pcp Per Pharmacy:   [id number] - [place], [place] - [address] AT [facility] OF [address] [place] [address] Phone: [phone] Fax: [phone]     Social Determinants of Health (SDOH) Interventions    Readmission Risk Interventions Readmission Risk Prevention Plan 03/25/2019  Post Dischage Appt Not Complete  Appt Comments (No Data)  Medication Screening Complete  Transportation Screening Complete

## 2019-03-26 MED ORDER — POLYETHYLENE GLYCOL 3350 17 G PO PACK
17.0000 g | PACK | Freq: Every day | ORAL | Status: DC | PRN
  Administered 2019-03-26: 17 g via ORAL
  Filled 2019-03-26: qty 1

## 2019-03-26 NOTE — Progress Notes (Signed)
  Date: 03/26/2019  Patient name: Randall Campbell  Medical record number: [id number]  Date of birth: October 01, 1971   Subjective:   Randall Campbell stated that he is doing well. Overall feels that he has turned a corner. Supplemental O2 was discontinued earlier this morning.  Objective:  Vital signs in last 24 hours: Vitals:   03/25/19 1500 03/25/19 1943 03/25/19 2312 03/26/19 0416  BP: 121/81 123/84 121/75 114/82  Pulse: 94 81 78 69  Resp: (!) 27 (!) 25 (!) 21 20  Temp: 99.7 F (37.6 C) 99.4 F (37.4 C) 99.3 F (37.4 C)   TempSrc: Axillary Oral Oral   SpO2: 94% 97% 98% 96%  Weight:      Height:      General: resting in bed, on Room air, max desat was to 92% while talking. Cardiac: RRR, no rubs, murmurs or gallops Pulm: clear to auscultation bilaterally Ext: warm and well perfused, no pedal edema   Assessment/Plan:  Randall Campbell is a 48 y.o m with no significant pmh  who presented with respiratory distress. He resulted positive for covid19 on 03/19/19.   Acute hypoxic respiratory failure 2/2 COVID 19 Covid19 testing positive 03/19/19 and cxr on 03/24/19 showed ill-defined opacities throughout bilateral lungs.   Patient remains afebrile without use of any antipyretics for the past 48 hours. Tmax 99.7  -continue hydroxychloroquine 200mg  qd day 3 for 4 days  -Will ask for patient to isolate himself for 72 hours after he is changed to room air. He is already 7 days post symptom onset.   -Please ambulate in room -Discused possible discharge home today, he does feel that he can self isolate, however is concerned about becoming SOB again.  Since he just came off supplemental O2 today would be fine to keep overnight for one more day of observation.   Dispo: Anticipated discharge tomorrow.  Gust Rung, DO 03/26/2019, 10:30 AM

## 2019-03-27 DIAGNOSIS — J1289 Other viral pneumonia: Secondary | ICD-10-CM

## 2019-03-27 DIAGNOSIS — J9601 Acute respiratory failure with hypoxia: Secondary | ICD-10-CM | POA: Diagnosis present

## 2019-03-27 MED ORDER — HYDROXYCHLOROQUINE SULFATE 200 MG PO TABS: 200.0000 mg | ORAL_TABLET | Freq: Two times a day (BID) | ORAL | 0 refills | Status: AC

## 2019-03-27 NOTE — Progress Notes (Signed)
  Date: 03/27/2019  Patient name: Randall Campbell  Medical record number: [id number]  Date of birth: [birth year]   Subjective:   Continues to do well, has not been out of bed but no SOB while in bed.  Overall no issues, his girlfriend has prepare a room for him to self isolate in.  Concerned about returning to work too quickly.  Objective:  Vital signs in last 24 hours: Vitals:   03/26/19 2045 03/26/19 2132 03/26/19 2356 03/27/19 0422  BP: 113/77   112/83  Pulse: 72  71 65  Resp: 17  20 17   Temp:  98.4 F (36.9 C) 98.1 F (36.7 C) 98.2 F (36.8 C)  TempSrc:  Oral Oral Oral  SpO2: 96%  96% 95%  Weight:      Height:      General: resting in bed, on Room air, max desat was to 94% while talking. Cardiac: RRR, no rubs, murmurs or gallops Pulm: clear to auscultation bilaterally Ext: warm and well perfused, no pedal edema  QTc 440.  Assessment/Plan:  Mr. Alen is a 48 y.o m with no significant pmh  who presented with respiratory distress. He resulted positive for covid19 on 03/19/19.   Acute hypoxic respiratory failure 2/2 COVID 19 Covid19 testing positive 03/19/19 and cxr on 03/24/19 showed ill-defined opacities throughout bilateral lungs.   Patient remains afebrile. No supplemental O2 requirement. - Has 4 doses of HCQ remaining, will get one dose shortly. Will check will pharmacy to see if other 3 doses could be delivered to room. -Will ask for patient to isolate himself for at least 72 hours after discharge if no worsening of SOB or fever (per CDC guidelines) -Will discharge home today, have written letter to remain out of work until 4/20.   Dispo: discharge home.  Gust Rung, DO 03/27/2019, 9:52 AM

## 2019-03-27 NOTE — Discharge Instructions (Signed)
I want you to self isolate for at least 3 days without any fever (and no use of tylenol or other fever reducing medications) Please call the [facility] at [phone] if you have any issues, someone should give you a call on the 14th to check in.      Person Under Monitoring Name: Randall Campbell  Location: [address] [place] [address]   Infection Prevention Recommendations for Individuals Confirmed to have, or Being Evaluated for, 2019 Novel Coronavirus (COVID-19) Infection Who Receive Care at Home  Individuals who are confirmed to have, or are being evaluated for, COVID-19 should follow the prevention steps below until a healthcare provider or local or state health department says they can return to normal activities.  Stay home except to get medical care You should restrict activities outside your home, except for getting medical care. Do not go to work, school, or public areas, and do not use public transportation or taxis.  Call ahead before visiting your doctor Before your medical appointment, call the healthcare provider and tell them that you have, or are being evaluated for, COVID-19 infection. This will help the healthcare providers office take steps to keep other people from getting infected. Ask your healthcare provider to call the local or state health department.  Monitor your symptoms Seek prompt medical attention if your illness is worsening (e.g., difficulty breathing). Before going to your medical appointment, call the healthcare provider and tell them that you have, or are being evaluated for, COVID-19 infection. Ask your healthcare provider to call the local or state health department.  Wear a facemask You should wear a facemask that covers your nose and mouth when you are in the same room with other people and when you visit a healthcare provider. People who live with or visit you should also wear a facemask while they are in  the same room with you.  Separate yourself from other people in your home As much as possible, you should stay in a different room from other people in your home. Also, you should use a separate bathroom, if available.  Avoid sharing household items You should not share dishes, drinking glasses, cups, eating utensils, towels, bedding, or other items with other people in your home. After using these items, you should wash them thoroughly with soap and water.  Cover your coughs and sneezes Cover your mouth and nose with a tissue when you cough or sneeze, or you can cough or sneeze into your sleeve. Throw used tissues in a lined trash can, and immediately wash your hands with soap and water for at least 20 seconds or use an alcohol-based hand rub.  Wash your [employer] your hands often and thoroughly with soap and water for at least 20 seconds. You can use an alcohol-based hand sanitizer if soap and water are not available and if your hands are not visibly dirty. Avoid touching your eyes, nose, and mouth with unwashed hands.   Prevention Steps for Caregivers and Household Members of Individuals Confirmed to have, or Being Evaluated for, COVID-19 Infection Being Cared for in the Home  If you live with, or provide care at home for, a person confirmed to have, or being evaluated for, COVID-19 infection please follow these guidelines to prevent infection:  Follow healthcare providers instructions Make sure that you understand and can help the patient follow any healthcare provider instructions for all care.  Provide for the patients basic needs You should help the patient with basic  needs in the home and provide support for getting groceries, prescriptions, and other personal needs.  Monitor the patients symptoms If they are getting sicker, call his or her medical provider and tell them that the patient has, or is being evaluated for, COVID-19 infection. This will help the healthcare  providers office take steps to keep other people from getting infected. Ask the healthcare provider to call the local or state health department.  Limit the number of people who have contact with the patient  If possible, have only one caregiver for the patient.  Other household members should stay in another home or place of residence. If this is not possible, they should stay  in another room, or be separated from the patient as much as possible. Use a separate bathroom, if available.  Restrict visitors who do not have an essential need to be in the home.  Keep older adults, very young children, and other sick people away from the patient Keep older adults, very young children, and those who have compromised immune systems or chronic health conditions away from the patient. This includes people with chronic heart, lung, or kidney conditions, diabetes, and cancer.  Ensure good ventilation Make sure that shared spaces in the home have good air flow, such as from an air conditioner or an opened window, weather permitting.  Wash your hands often  Wash your hands often and thoroughly with soap and water for at least 20 seconds. You can use an alcohol based hand sanitizer if soap and water are not available and if your hands are not visibly dirty.  Avoid touching your eyes, nose, and mouth with unwashed hands.  Use disposable paper towels to dry your hands. If not available, use dedicated cloth towels and replace them when they become wet.  Wear a facemask and gloves  Wear a disposable facemask at all times in the room and gloves when you touch or have contact with the patients blood, body fluids, and/or secretions or excretions, such as sweat, saliva, sputum, nasal mucus, vomit, urine, or feces.  Ensure the mask fits over your nose and mouth tightly, and do not touch it during use.  Throw out disposable facemasks and gloves after using them. Do not reuse.  Wash your hands immediately  after removing your facemask and gloves.  If your personal clothing becomes contaminated, carefully remove clothing and launder. Wash your hands after handling contaminated clothing.  Place all used disposable facemasks, gloves, and other waste in a lined container before disposing them with other household waste.  Remove gloves and wash your hands immediately after handling these items.  Do not share dishes, glasses, or other household items with the patient  Avoid sharing household items. You should not share dishes, drinking glasses, cups, eating utensils, towels, bedding, or other items with a patient who is confirmed to have, or being evaluated for, COVID-19 infection.  After the person uses these items, you should wash them thoroughly with soap and water.  Wash laundry thoroughly  Immediately remove and wash clothes or bedding that have blood, body fluids, and/or secretions or excretions, such as sweat, saliva, sputum, nasal mucus, vomit, urine, or feces, on them.  Wear gloves when handling laundry from the patient.  Read and follow directions on labels of laundry or clothing items and detergent. In general, wash and dry with the warmest temperatures recommended on the label.  Clean all areas the individual has used often  Clean all touchable surfaces, such as counters, tabletops, doorknobs,  bathroom fixtures, toilets, phones, keyboards, tablets, and bedside tables, every day. Also, clean any surfaces that may have blood, body fluids, and/or secretions or excretions on them.  Wear gloves when cleaning surfaces the patient has come in contact with.  Use a diluted bleach solution (e.g., dilute bleach with 1 part bleach and 10 parts water) or a household disinfectant with a label that says EPA-registered for coronaviruses. To make a bleach solution at home, add 1 tablespoon of bleach to 1 quart (4 cups) of water. For a larger supply, add  cup of bleach to 1 gallon (16 cups) of  water.  Read labels of cleaning products and follow recommendations provided on product labels. Labels contain instructions for safe and effective use of the cleaning product including precautions you should take when applying the product, such as wearing gloves or eye protection and making sure you have good ventilation during use of the product.  Remove gloves and wash hands immediately after cleaning.  Monitor yourself for signs and symptoms of illness Caregivers and household members are considered close contacts, should monitor their health, and will be asked to limit movement outside of the home to the extent possible. Follow the monitoring steps for close contacts listed on the symptom monitoring form.   ? If you have additional questions, contact your local health department or call the epidemiologist on call at [phone] (available 24/7). ? This guidance is subject to change. For the most up-to-date guidance from [facility], please refer to their website: [email or web address]hu

## 2019-03-27 NOTE — Plan of Care (Signed)
  Problem: Education: Goal: Knowledge of General Education information will improve Description Including pain rating scale, medication(s)/side effects and non-pharmacologic comfort measures Outcome: Not Progressing   Problem: Clinical Measurements: Goal: Ability to maintain clinical measurements within normal limits will improve Outcome: Not Progressing Goal: Will remain free from infection Outcome: Not Progressing Goal: Diagnostic test results will improve Outcome: Not Progressing Goal: Respiratory complications will improve Outcome: Not Progressing Goal: Cardiovascular complication will be avoided Outcome: Not Progressing   Problem: Activity: Goal: Risk for activity intolerance will decrease Outcome: Not Progressing   Problem: Nutrition: Goal: Adequate nutrition will be maintained Outcome: Not Progressing   Problem: Coping: Goal: Level of anxiety will decrease Outcome: Not Progressing   Problem: Elimination: Goal: Will not experience complications related to bowel motility Outcome: Not Progressing Goal: Will not experience complications related to urinary retention Outcome: Not Progressing   Problem: Skin Integrity: Goal: Risk for impaired skin integrity will decrease Outcome: Not Progressing

## 2019-03-30 ENCOUNTER — Ambulatory Visit (INDEPENDENT_AMBULATORY_CARE_PROVIDER_SITE_OTHER): Payer: BLUE CROSS/BLUE SHIELD | Admitting: Internal Medicine

## 2019-03-30 ENCOUNTER — Other Ambulatory Visit: Payer: Self-pay

## 2019-03-30 DIAGNOSIS — J988 Other specified respiratory disorders: Secondary | ICD-10-CM | POA: Diagnosis not present

## 2019-03-30 NOTE — Progress Notes (Addendum)
   CC: COVID-19 PNA  This is a telephone encounter between Randall Campbell and [employer] on 03/30/2019 for COVID-19 PNA. The visit was conducted with the patient located at home and Levora Dredge at [facility]. The patient's identity was confirmed using their DOB and current address. The patient has consented to being evaluated through a telephone encounter and understands the associated risks (an examination cannot be done and the patient may need to come in for an appointment) / benefits (allows the patient to remain at home, decreasing exposure to coronavirus). I personally spent 7 minutes on medical discussion.   HPI:  [name] is a 48 y.o. with PMH as below.   Please see A&P for assessment of the patient's acute and chronic medical conditions.   No past medical history on file.   Review of Systems:  Performed and all others negative.  Assessment & Plan:   See Encounters Tab for problem based charting.  Patient discussed with Dr. Cyndie Chime  Medicine attending: Medical history, presenting problems, physical complaints, and medications, reviewed with resident physician Dr Levora Dredge on the day of the patient telephone consultationand I concur with his evaluation and management plan.

## 2019-03-30 NOTE — Assessment & Plan Note (Signed)
HPI: Patient recently admitted for COVID-19 pneumonia. Symptoms started on 03/17/2019. He was discharged on 4/11 in stable condition. He was not requiring oxygen at that point. He was prescribed hydroxychloroquine for total of five days. Since getting home is fevers, shortness of breath, and cough have all resolved. He finished his Plaquenil on 4/13. He has been social distancing for the last several days. He is wondering when he can come out of social distancing. He is tolerating PO intake. He complains that he can still not taste or smell anything.  A/P: - Symptoms appear to have resolved. - Completed course of Plaquenil. - Discussed continuing to social distance. Told patient to remain in isolation for another 24 to 48 hours and then he will have completed 14 days since symptoms onset.

## 2020-04-03 IMAGING — DX PORTABLE CHEST - 1 VIEW
1 series · 1 of 1 positions shown · non-contrast
Comparison: None.

CLINICAL DATA: Five day history of fever and body aches

EXAM:
PORTABLE CHEST 1 VIEW

[chest ap]
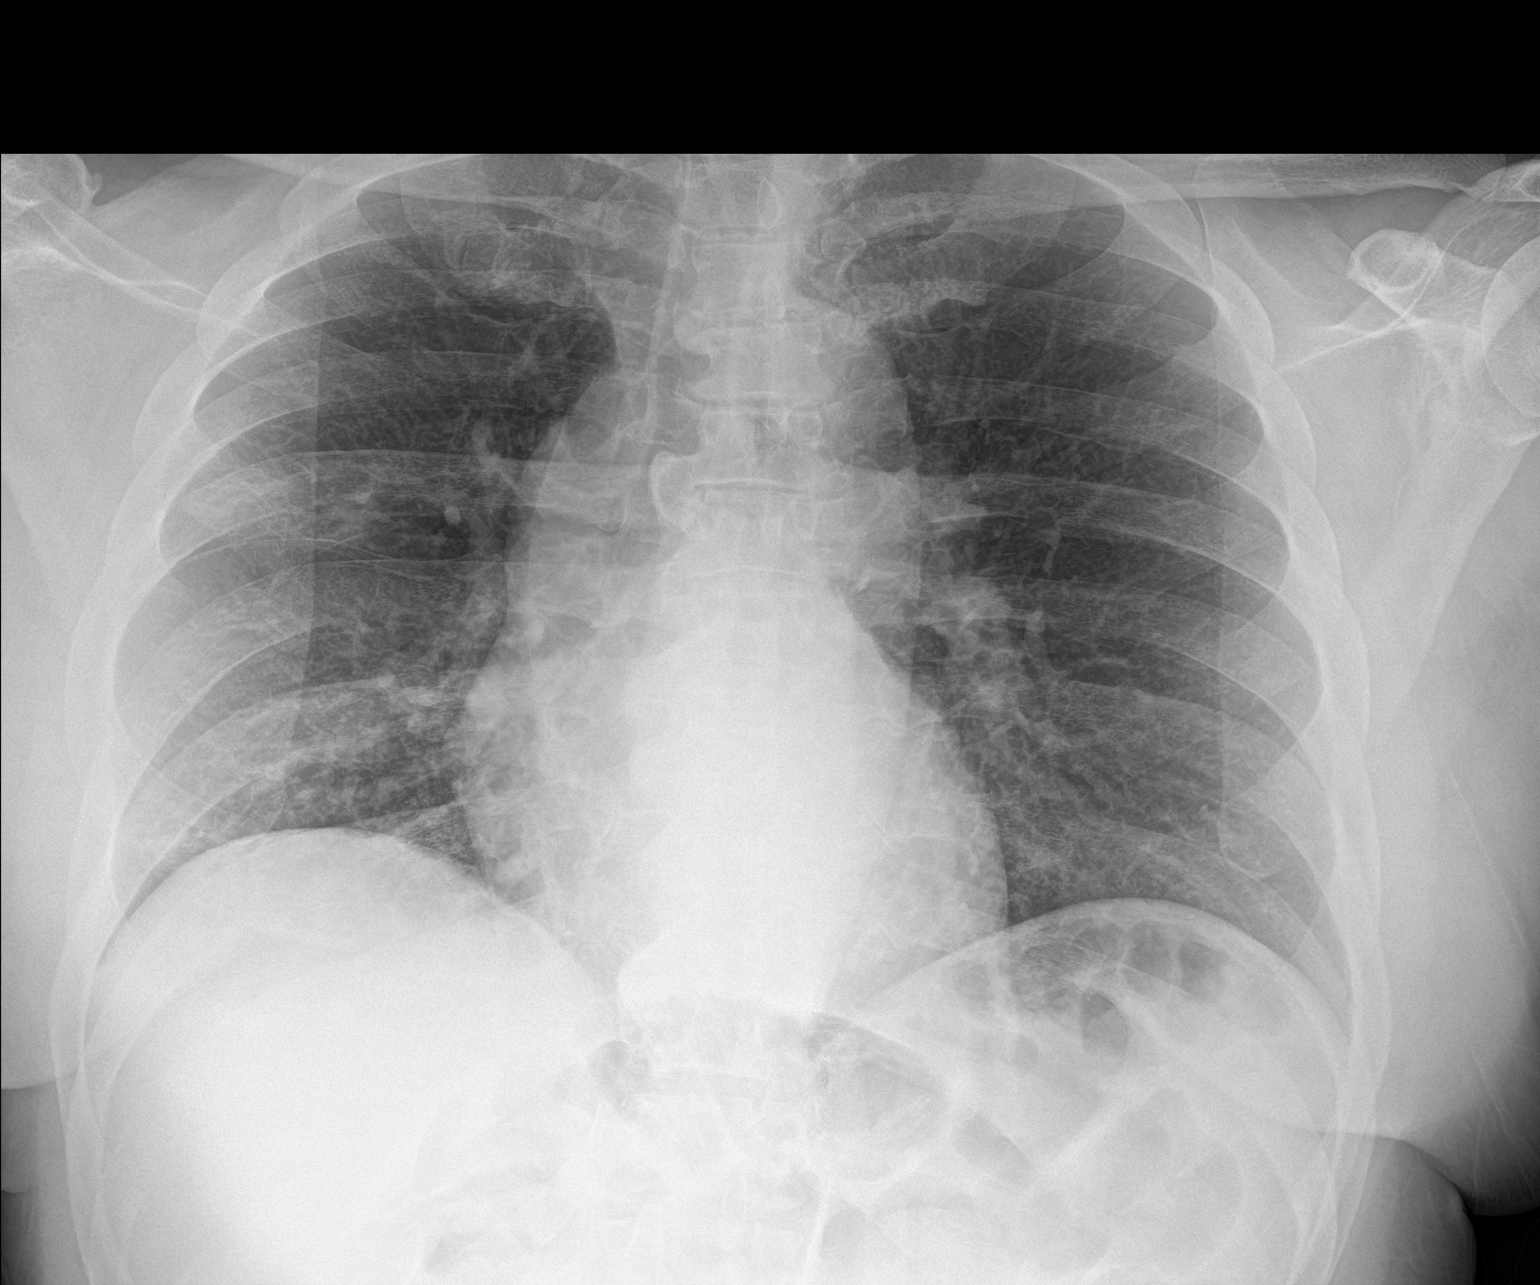

[1 of 1 positions shown; findings below may reference images not displayed]

FINDINGS: Lungs are adequately inflated with subtle hazy prominence of the
bronchovascular markings over the right upper lung and lung bases.
No focal lobar consolidation or effusion. Cardiomediastinal
silhouette is within normal. There are degenerative changes of the
spine.
IMPRESSION: Subtle hazy prominence of the bronchovascular markings over the lung
bases and right upper lung which may be seen with atypical
infection, inflammatory processes or possible viral pneumonia.

## 2020-04-08 IMAGING — DX PORTABLE CHEST - 1 VIEW
1 series · 1 of 1 positions shown · non-contrast
Comparison: Radiograph 03/19/2019

CLINICAL DATA: Worsening shortness of breath. Covert positive
patient.

EXAM:
PORTABLE CHEST 1 VIEW

[chest ap]
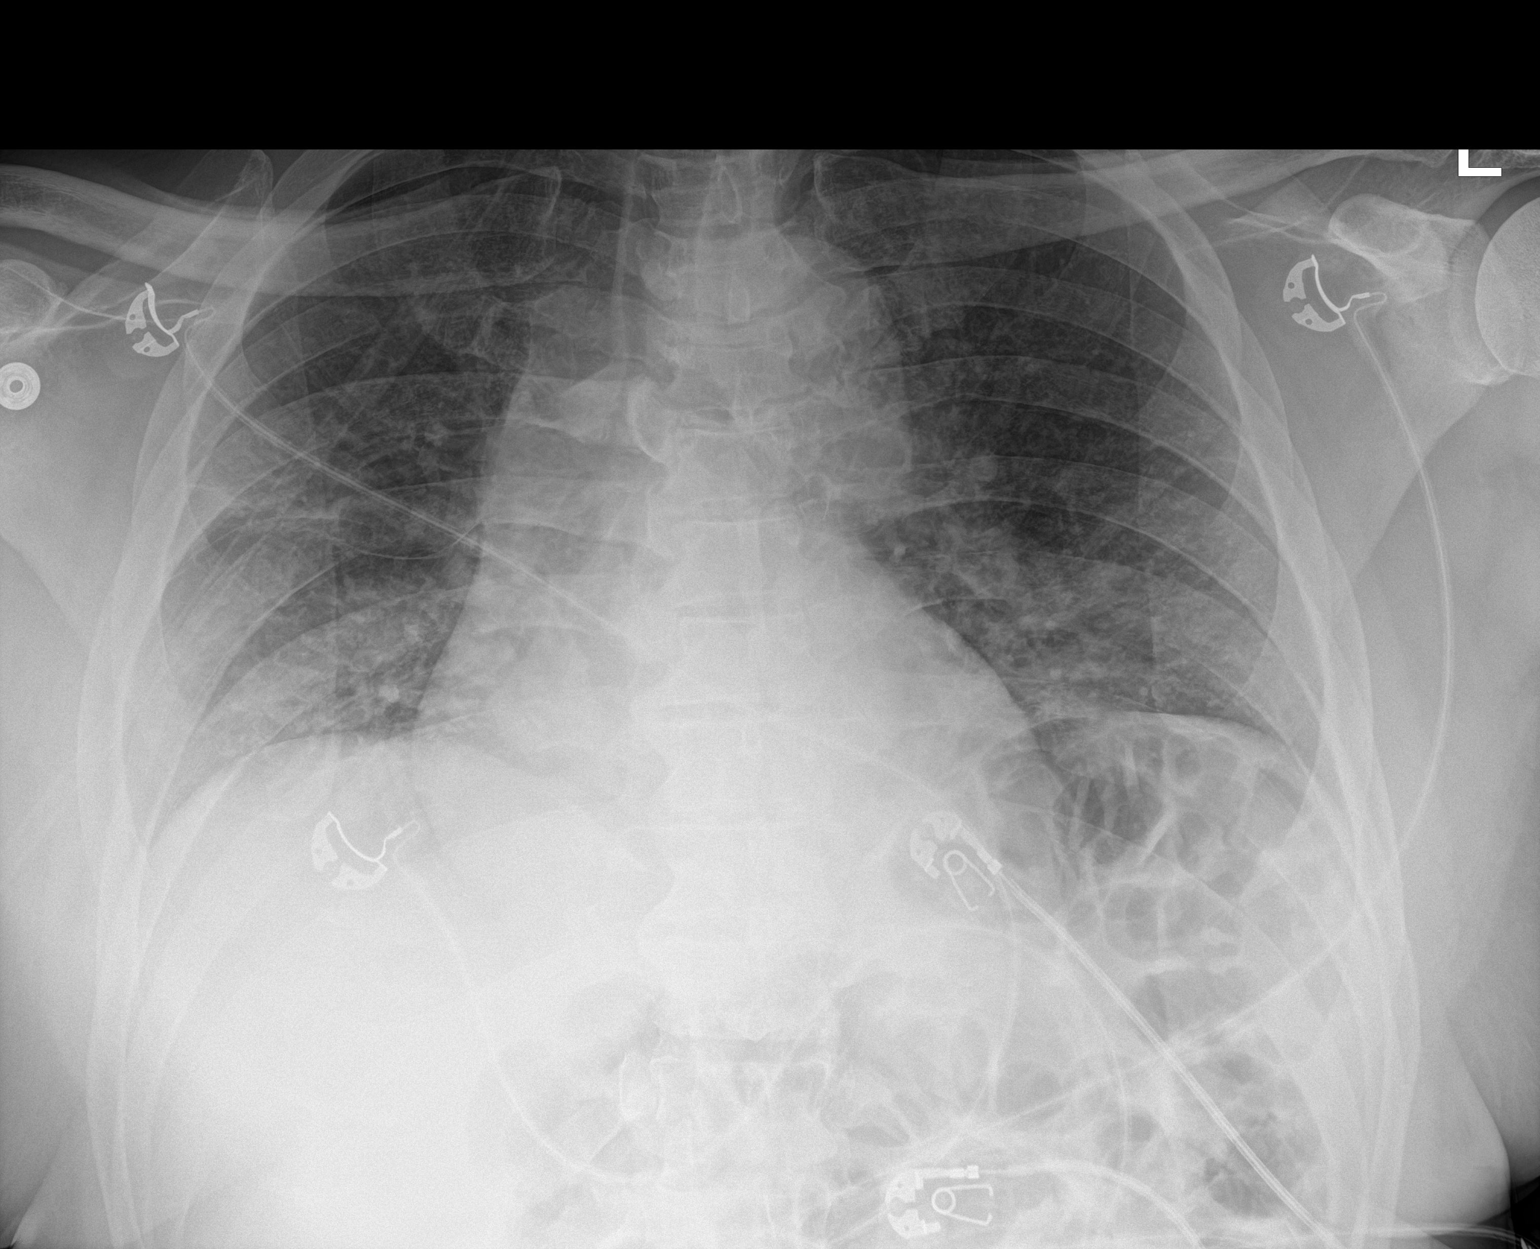

[1 of 1 positions shown; findings below may reference images not displayed]

FINDINGS: Lower lung volumes from prior exam. Progressive ill-defined
multifocal opacities throughout both lungs. Slight enlargement
cardiac silhouette likely due to lower lung volumes. Unchanged
mediastinal contours. No large pleural effusion or pneumothorax.
IMPRESSION: Progressive ill-defined opacities throughout both lungs, most
consistent with progressive multifocal pneumonia, pattern typical of
[id number].
